# Patient Record
Sex: Female | Born: 1965 | Race: Black or African American | Hispanic: No | Marital: Single | Smoking: Never smoker
Health system: Southern US, Community
[De-identification: ages and names within clinical notes are randomized; demographics above are authoritative.]

## PROBLEM LIST (undated history)

## (undated) DIAGNOSIS — I639 Cerebral infarction, unspecified: Secondary | ICD-10-CM

## (undated) DIAGNOSIS — I219 Acute myocardial infarction, unspecified: Secondary | ICD-10-CM

## (undated) DIAGNOSIS — N289 Disorder of kidney and ureter, unspecified: Secondary | ICD-10-CM

## (undated) DIAGNOSIS — E7800 Pure hypercholesterolemia, unspecified: Secondary | ICD-10-CM

## (undated) DIAGNOSIS — D649 Anemia, unspecified: Secondary | ICD-10-CM

## (undated) DIAGNOSIS — M109 Gout, unspecified: Secondary | ICD-10-CM

## (undated) DIAGNOSIS — I10 Essential (primary) hypertension: Secondary | ICD-10-CM

## (undated) DIAGNOSIS — I2510 Atherosclerotic heart disease of native coronary artery without angina pectoris: Secondary | ICD-10-CM

## (undated) DIAGNOSIS — M1990 Unspecified osteoarthritis, unspecified site: Secondary | ICD-10-CM

## (undated) HISTORY — PX: APPENDECTOMY: SHX54

## (undated) HISTORY — PX: TUBAL LIGATION: SHX77

## (undated) HISTORY — PX: CHOLECYSTECTOMY: SHX55

---

## 2017-02-07 ENCOUNTER — Emergency Department (HOSPITAL_COMMUNITY): Payer: Medicaid - Out of State

## 2017-02-07 ENCOUNTER — Other Ambulatory Visit: Payer: Self-pay

## 2017-02-07 ENCOUNTER — Emergency Department (HOSPITAL_COMMUNITY)
Admission: EM | Admit: 2017-02-07 | Discharge: 2017-02-07 | Disposition: A | Discharge status: Home / Self Care | Payer: Medicaid - Out of State | Attending: Emergency Medicine | Admitting: Emergency Medicine

## 2017-02-07 ENCOUNTER — Encounter (HOSPITAL_COMMUNITY): Payer: Self-pay

## 2017-02-07 DIAGNOSIS — Z79899 Other long term (current) drug therapy: Secondary | ICD-10-CM | POA: Insufficient documentation

## 2017-02-07 DIAGNOSIS — I10 Essential (primary) hypertension: Secondary | ICD-10-CM | POA: Diagnosis not present

## 2017-02-07 DIAGNOSIS — R197 Diarrhea, unspecified: Secondary | ICD-10-CM | POA: Diagnosis not present

## 2017-02-07 DIAGNOSIS — R1110 Vomiting, unspecified: Secondary | ICD-10-CM | POA: Diagnosis not present

## 2017-02-07 DIAGNOSIS — R079 Chest pain, unspecified: Secondary | ICD-10-CM | POA: Diagnosis present

## 2017-02-07 DIAGNOSIS — I259 Chronic ischemic heart disease, unspecified: Secondary | ICD-10-CM | POA: Diagnosis not present

## 2017-02-07 DIAGNOSIS — R1010 Upper abdominal pain, unspecified: Secondary | ICD-10-CM | POA: Diagnosis not present

## 2017-02-07 DIAGNOSIS — R0789 Other chest pain: Secondary | ICD-10-CM | POA: Diagnosis not present

## 2017-02-07 HISTORY — DX: Unspecified osteoarthritis, unspecified site: M19.90

## 2017-02-07 HISTORY — DX: Pure hypercholesterolemia, unspecified: E78.00

## 2017-02-07 HISTORY — DX: Acute myocardial infarction, unspecified: I21.9

## 2017-02-07 HISTORY — DX: Disorder of kidney and ureter, unspecified: N28.9

## 2017-02-07 HISTORY — DX: Anemia, unspecified: D64.9

## 2017-02-07 HISTORY — DX: Gout, unspecified: M10.9

## 2017-02-07 HISTORY — DX: Atherosclerotic heart disease of native coronary artery without angina pectoris: I25.10

## 2017-02-07 HISTORY — DX: Essential (primary) hypertension: I10

## 2017-02-07 HISTORY — DX: Cerebral infarction, unspecified: I63.9

## 2017-02-07 LAB — RAPID URINE DRUG SCREEN, HOSP PERFORMED
Amphetamines: NOT DETECTED
BENZODIAZEPINES: NOT DETECTED
Barbiturates: NOT DETECTED
COCAINE: NOT DETECTED
Opiates: POSITIVE — AB
TETRAHYDROCANNABINOL: NOT DETECTED

## 2017-02-07 LAB — COMPREHENSIVE METABOLIC PANEL
ALK PHOS: 56 U/L (ref 38–126)
ALT: 12 U/L — ABNORMAL LOW (ref 14–54)
ANION GAP: 17 — AB (ref 5–15)
AST: 22 U/L (ref 15–41)
Albumin: 4.6 g/dL (ref 3.5–5.0)
BILIRUBIN TOTAL: 0.4 mg/dL (ref 0.3–1.2)
BUN: 34 mg/dL — ABNORMAL HIGH (ref 6–20)
CALCIUM: 9.5 mg/dL (ref 8.9–10.3)
CO2: 19 mmol/L — AB (ref 22–32)
Chloride: 104 mmol/L (ref 101–111)
Creatinine, Ser: 2.54 mg/dL — ABNORMAL HIGH (ref 0.44–1.00)
GFR calc non Af Amer: 21 mL/min — ABNORMAL LOW (ref >60)
GFR, EST AFRICAN AMERICAN: 24 mL/min — AB (ref >60)
Glucose, Bld: 79 mg/dL (ref 65–99)
Potassium: 3.8 mmol/L (ref 3.5–5.1)
SODIUM: 140 mmol/L (ref 135–145)
TOTAL PROTEIN: 8.8 g/dL — AB (ref 6.5–8.1)

## 2017-02-07 LAB — POC OCCULT BLOOD, ED: Fecal Occult Bld: NEGATIVE

## 2017-02-07 LAB — CBC WITH DIFFERENTIAL/PLATELET
Basophils Absolute: 0 10*3/uL (ref 0.0–0.1)
Basophils Relative: 0 %
EOS ABS: 0.1 10*3/uL (ref 0.0–0.7)
Eosinophils Relative: 2 %
HCT: 35.3 % — ABNORMAL LOW (ref 36.0–46.0)
Hemoglobin: 11.4 g/dL — ABNORMAL LOW (ref 12.0–15.0)
LYMPHS ABS: 1.9 10*3/uL (ref 0.7–4.0)
Lymphocytes Relative: 26 %
MCH: 29.8 pg (ref 26.0–34.0)
MCHC: 32.3 g/dL (ref 30.0–36.0)
MCV: 92.4 fL (ref 78.0–100.0)
MONO ABS: 0.5 10*3/uL (ref 0.1–1.0)
MONOS PCT: 6 %
NEUTROS PCT: 66 %
Neutro Abs: 4.7 10*3/uL (ref 1.7–7.7)
Platelets: 372 10*3/uL (ref 150–400)
RBC: 3.82 MIL/uL — ABNORMAL LOW (ref 3.87–5.11)
RDW: 16.1 % — AB (ref 11.5–15.5)
WBC: 7.1 10*3/uL (ref 4.0–10.5)

## 2017-02-07 LAB — URINALYSIS, ROUTINE W REFLEX MICROSCOPIC
Bilirubin Urine: NEGATIVE
Glucose, UA: NEGATIVE mg/dL
Hgb urine dipstick: NEGATIVE
Ketones, ur: NEGATIVE mg/dL
Leukocytes, UA: NEGATIVE
Nitrite: NEGATIVE
PH: 5 (ref 5.0–8.0)
Protein, ur: 100 mg/dL — AB
RBC / HPF: NONE SEEN RBC/hpf (ref 0–5)
SPECIFIC GRAVITY, URINE: 1.012 (ref 1.005–1.030)

## 2017-02-07 LAB — TROPONIN I: Troponin I: <0.03 ng/mL (ref <0.03)

## 2017-02-07 LAB — LIPASE, BLOOD: Lipase: 45 U/L (ref 11–51)

## 2017-02-07 MED ORDER — GI COCKTAIL ~~LOC~~
30.0000 mL | Freq: Once | ORAL | Status: AC
  Administered 2017-02-07: 30 mL via ORAL
  Filled 2017-02-07: qty 30

## 2017-02-07 MED ORDER — IOPAMIDOL (ISOVUE-300) INJECTION 61%
INTRAVENOUS | Status: AC
  Filled 2017-02-07: qty 30

## 2017-02-07 MED ORDER — ONDANSETRON HCL 4 MG PO TABS: 4.0000 mg | ORAL_TABLET | Freq: Four times a day (QID) | ORAL | 0 refills | Status: AC | PRN

## 2017-02-07 MED ORDER — MORPHINE SULFATE (PF) 4 MG/ML IV SOLN
4.0000 mg | Freq: Once | INTRAVENOUS | Status: AC
  Administered 2017-02-07: 4 mg via INTRAVENOUS
  Filled 2017-02-07: qty 1

## 2017-02-07 MED ORDER — SODIUM CHLORIDE 0.9 % IV SOLN
80.0000 mg | Freq: Once | INTRAVENOUS | Status: AC
  Administered 2017-02-07: 13:00:00 80 mg via INTRAVENOUS
  Filled 2017-02-07: qty 80

## 2017-02-07 MED ORDER — SODIUM CHLORIDE 0.9 % IV BOLUS (SEPSIS)
500.0000 mL | Freq: Once | INTRAVENOUS | Status: AC
  Administered 2017-02-07: 500 mL via INTRAVENOUS

## 2017-02-07 MED ORDER — ONDANSETRON HCL 4 MG/2ML IJ SOLN
4.0000 mg | Freq: Once | INTRAMUSCULAR | Status: AC
  Administered 2017-02-07: 4 mg via INTRAVENOUS
  Filled 2017-02-07: qty 2

## 2017-02-07 MED ORDER — SUCRALFATE 1 G PO TABS: 1.0000 g | ORAL_TABLET | Freq: Three times a day (TID) | ORAL | 0 refills | Status: AC

## 2017-02-07 NOTE — ED Notes (Signed)
EKG given to Dr. Knapp. 

## 2017-02-07 NOTE — ED Triage Notes (Signed)
Patient reports of mid chest pain that started 3 days ago that radiates down left arm with left arm tingling x3 days. Also reports of lowe abdominal pain with vomiting and diarrhea x4 days.

## 2017-02-07 NOTE — ED Provider Notes (Signed)
[facility] EMERGENCY DEPARTMENT Provider Note   CSN: [id number] Arrival date & time: 02/07/17  1143     History   Chief Complaint Chief Complaint  Patient presents with  . Chest Pain  . Abdominal Pain    HPI Susan Watts is a 52 y.o. female.  HPI Patient presents with abdominal pain, vomiting and diarrhea which started 4 days ago.  She has had multiple episodes of each.  Denies any melanotic or grossly bloody stools.  States that 2 days ago she developed pain in the upper abdomen radiating into her chest.  Today she is vomited 3 times and noted blood clot in the vomit.  Continues to have central chest pain.  No shortness of breath.  No new lower extremity swelling or pain. Past Medical History:  Diagnosis Date  . Anemia   . Arthritis   . Coronary artery disease   . Gout   . High cholesterol   . Hypertension   . MI (myocardial infarction) (HCC)   . Renal disorder   . Stroke [facility])     There are no active problems to display for this patient.   Past Surgical History:  Procedure Laterality Date  . APPENDECTOMY    . CHOLECYSTECTOMY    . TUBAL LIGATION      OB History    No data available       Home Medications    Prior to Admission medications   Medication Sig Start Date End Date Taking? Authorizing Provider  allopurinol (ZYLOPRIM) 100 MG tablet Take 100 mg by mouth daily as needed.   Yes [provider]  amLODipine (NORVASC) 10 MG tablet Take 10 mg by mouth daily.   Yes [provider]  atorvastatin (LIPITOR) 20 MG tablet Take 20 mg by mouth daily.    Yes [provider]  colchicine 0.6 MG tablet Take 0.6 mg by mouth daily.   Yes [provider]  ferrous sulfate 325 (65 FE) MG EC tablet Take 325 mg by mouth 3 (three) times daily with meals.   Yes [provider]  pantoprazole (PROTONIX) 40 MG tablet Take 40 mg by mouth daily.   Yes [provider]  predniSONE (DELTASONE) 20 MG tablet Take 20 mg by mouth  daily as needed.    Yes [provider]  Vitamin D, Ergocalciferol, (DRISDOL) 50000 units CAPS capsule Take 50,000 Units by mouth every 7 (seven) days.   Yes [provider]  ondansetron (ZOFRAN) 4 MG tablet Take 1 tablet (4 mg total) by mouth every 6 (six) hours as needed for nausea or vomiting. 02/07/17   Loren Racer, MD  sucralfate (CARAFATE) 1 g tablet Take 1 tablet (1 g total) by mouth 4 (four) times daily -  with meals and at bedtime. 02/07/17   Loren Racer, MD    Family History No family history on file.  Social History Social History   Tobacco Use  . Smoking status: Never Smoker  . Smokeless tobacco: Never Used  Substance Use Topics  . Alcohol use: No    Frequency: Never  . Drug use: No     Allergies   Aspirin   Review of Systems Review of Systems  Constitutional: Negative for chills and fever.  Respiratory: Negative for cough and shortness of breath.   Cardiovascular: Positive for chest pain. Negative for palpitations and leg swelling.  Gastrointestinal: Positive for abdominal pain, diarrhea, nausea and vomiting. Negative for blood in stool.  Genitourinary: Negative for dysuria, flank pain,  frequency and hematuria.  Musculoskeletal: Negative for back pain, myalgias and neck pain.  Skin: Negative for rash and wound.  Neurological: Negative for dizziness, weakness, light-headedness, numbness and headaches.  All other systems reviewed and are negative.    Physical Exam Updated Vital Signs BP (!) 129/99   Pulse (!) 113   Temp 98.2 F (36.8 C) (Oral)   Resp (!) 21   Ht 5\' 2"  (1.575 m)   Wt 42.6 kg (94 lb)   SpO2 99%   BMI 17.19 kg/m   Physical Exam  Constitutional: She is oriented to person, place, and time. She appears well-developed and well-nourished. No distress.  Emaciated appearing  HENT:  Head: Normocephalic and atraumatic.  Mouth/Throat: Oropharynx is clear and moist. No oropharyngeal exudate.  Eyes: EOM are normal. Pupils  are equal, round, and reactive to light.  Neck: Normal range of motion. Neck supple.  Cardiovascular: Regular rhythm. Exam reveals no gallop and no friction rub.  No murmur heard. Tachycardia  Pulmonary/Chest: Effort normal and breath sounds normal. No stridor. No respiratory distress. She has no wheezes. She has no rales. She exhibits tenderness.  Patient has some central lobar chest tenderness to palpation.  There is no crepitance or deformity.  Abdominal: Soft. Bowel sounds are normal. There is tenderness. There is no rebound and no guarding.  Diffuse abdominal tenderness most pronounced in the epigastrium and bilateral lower quadrants.  No rebound or guarding.  Musculoskeletal: Normal range of motion. She exhibits no edema or tenderness.  No CVA tenderness.  No lower extremity swelling, asymmetry or tenderness.  Neurological: She is alert and oriented to person, place, and time.  Moves all extremities without focal deficit.  Sensation intact.  Skin: Skin is warm and dry. Capillary refill takes less than 2 seconds. No rash noted. She is not diaphoretic. No erythema.  Psychiatric: She has a normal mood and affect. Her behavior is normal.  Nursing note and vitals reviewed.    ED Treatments / Results  Labs (all labs ordered are listed, but only abnormal results are displayed) Labs Reviewed  CBC WITH DIFFERENTIAL/PLATELET - Abnormal; Notable for the following components:      Result Value   RBC 3.82 (*)    Hemoglobin 11.4 (*)    HCT 35.3 (*)    RDW 16.1 (*)    All other components within normal limits  COMPREHENSIVE METABOLIC PANEL - Abnormal; Notable for the following components:   CO2 19 (*)    BUN 34 (*)    Creatinine, Ser 2.54 (*)    Total Protein 8.8 (*)    ALT 12 (*)    GFR calc non Af Amer 21 (*)    GFR calc Af Amer 24 (*)    Anion gap 17 (*)    All other components within normal limits  URINALYSIS, ROUTINE W REFLEX MICROSCOPIC - Abnormal; Notable for the following  components:   APPearance HAZY (*)    Protein, ur 100 (*)    Bacteria, UA RARE (*)    Squamous Epithelial / LPF 6-30 (*)    All other components within normal limits  RAPID URINE DRUG SCREEN, HOSP PERFORMED - Abnormal; Notable for the following components:   Opiates POSITIVE (*)    All other components within normal limits  LIPASE, BLOOD  TROPONIN I  TROPONIN I  OCCULT BLOOD X 1 CARD TO LAB, STOOL  POC OCCULT BLOOD, ED    EKG  EKG Interpretation  Date/Time:  Saturday February 07 2017 11:54:53  EST Ventricular Rate:  107 PR Interval:    QRS Duration: 104 QT Interval:  350 QTC Calculation: 467 R Axis:   -8 Text Interpretation:  Sinus tachycardia Probable left atrial enlargement Abnormal T, consider ischemia, lateral leads No old tracing to compare Confirmed by Linwood Dibbles [phone]) on 02/07/2017 11:58:38 AM       Radiology Ct Abdomen Pelvis Wo Contrast  Result Date: 02/07/2017 CLINICAL DATA:  Abdominal pain in the lower abdomen for 4 days. Nausea, vomiting, diarrhea EXAM: CT ABDOMEN AND PELVIS WITHOUT CONTRAST TECHNIQUE: Multidetector CT imaging of the abdomen and pelvis was performed following the standard protocol without IV contrast. COMPARISON:  None. FINDINGS: Lower chest: Bibasilar atelectasis versus mild scarring. Hepatobiliary: No focal liver abnormality is seen. Status post cholecystectomy. No biliary dilatation. Pancreas: Unremarkable. No pancreatic ductal dilatation or surrounding inflammatory changes. Spleen: Normal in size without focal abnormality. Adrenals/Urinary Tract: Normal adrenal glands. Normal left kidney. Atrophic right kidney. Nonobstructing 7 mm right lower pole renal calculus. Overall, no obstructive uropathy. 7 mm calcification anterior to the psoas muscle in the region of right ureter at the level of L5 which is difficult to delineate whether it is within the ureter or a phlebolith adjacent to the ureter, but there is no obstructive uropathy resulting from the  calcification suggesting calcification outside the ureter versus a chronic ureteral calculus in a nonfunctioning kidney. Normal distended bladder. Stomach/Bowel: Small hiatal hernia. Prior appendectomy. No evidence of bowel wall thickening, distention, or inflammatory changes. Large amount of stool in the rectum. Vascular/Lymphatic: Normal caliber abdominal aorta. No lymphadenopathy. Reproductive: Uterus and bilateral adnexa are unremarkable. Other: No abdominal wall hernia or abnormality. No abdominopelvic ascites. Musculoskeletal: No acute osseous abnormality. No aggressive osseous lesion. IMPRESSION: 1. Rectal fecal impaction. 2. No bowel obstruction. 3. Atrophic right kidney. Nonobstructing 7 mm right lower pole renal calculus. 7 mm calcification anterior to the psoas muscle in the region of right ureter at the level of L5 which is difficult to delineate whether it is within the ureter or adjacent to the ureter, but there is no obstructive uropathy resulting from the calcification suggesting calcification outside the ureter versus a chronic ureteral calculus in a nonfunctioning kidney. Electronically Signed   By: Elige Ko   On: 02/07/2017 17:02   Dg Chest 2 View  Result Date: 02/07/2017 CLINICAL DATA:  Chest pain and weakness, Patient reports of mid chest pain that started 3 days ago that radiates down left arm with left arm tingling x3 days. Also reports of lowe abdominal pain with vomiting and diarrhea x4 days. EXAM: CHEST - 2 VIEW COMPARISON:  none FINDINGS: Lungs are clear. Heart size and mediastinal contours are within normal limits. No effusion. Visualized bones unremarkable.  Cholecystectomy clips. IMPRESSION: No acute cardiopulmonary disease. Electronically Signed   By: Corlis Leak M.D.   On: 02/07/2017 13:32    Procedures Procedures (including critical care time)  Medications Ordered in ED Medications  iopamidol (ISOVUE-300) 61 % injection (not administered)  sodium chloride 0.9 % bolus  500 mL (0 mLs Intravenous Stopped 02/07/17 1405)  morphine 4 MG/ML injection 4 mg (4 mg Intravenous Given 02/07/17 1303)  ondansetron (ZOFRAN) injection 4 mg (4 mg Intravenous Given 02/07/17 1303)  pantoprazole (PROTONIX) 80 mg in sodium chloride 0.9 % 100 mL IVPB (0 mg Intravenous Stopped 02/07/17 1405)  sodium chloride 0.9 % bolus 500 mL (0 mLs Intravenous Stopped 02/07/17 1500)  gi cocktail (Maalox,Lidocaine,Donnatal) (30 mLs Oral Given 02/07/17 1743)     Initial Impression /  Assessment and Plan / ED Course  I have reviewed the triage vital signs and the nursing notes.  Pertinent labs & imaging results that were available during my care of the patient were reviewed by me and considered in my medical decision making (see chart for details).    Patient's creatinine mildly elevated over her baseline of 2.  Hemoglobin is stable compared to previous lab test performed earlier this year.  She does have a history of gastritis which is likely responsible for her pain.  She is advised to continue Protonix will start on Carafate.  Will give referral to gastroenterology.  Return precautions have been given.  Chest pain is atypical and likely related to her gastric intestinal symptoms.  Troponin x2 is normal.  Low suspicion for PE.  Oral intake.  She has had no further vomiting in the emergency department.  Hemoccult stool is negative. Final Clinical Impressions(s) / ED Diagnoses   Final diagnoses:  Pain of upper abdomen  Atypical chest pain  Non-intractable vomiting, presence of nausea not specified, unspecified vomiting type    ED Discharge Orders        Ordered    sucralfate (CARAFATE) 1 g tablet  3 times daily with meals & bedtime     02/07/17 1855    ondansetron (ZOFRAN) 4 MG tablet  Every 6 hours PRN     02/07/17 1855       Loren RacerYelverton, Journe Hallmark, MD 02/07/17 [phone]

## 2019-05-03 IMAGING — CT CT ABD-PELV W/O CM
2 of 4 series · 16 of 46 positions shown, 18 images · non-contrast
Comparison: None.

CLINICAL DATA: Abdominal pain in the lower abdomen for 4 days.
Nausea, vomiting, diarrhea

EXAM:
CT ABDOMEN AND PELVIS WITHOUT CONTRAST
TECHNIQUE: Multidetector CT imaging of the abdomen and pelvis was performed
following the standard protocol without IV contrast.

[Series 2: axial st · axial · 0.67mm/px · z∈[+958,+1373]mm · 13 of 91 slices shown, 15 images]
[im 4/91  soft-tissue]
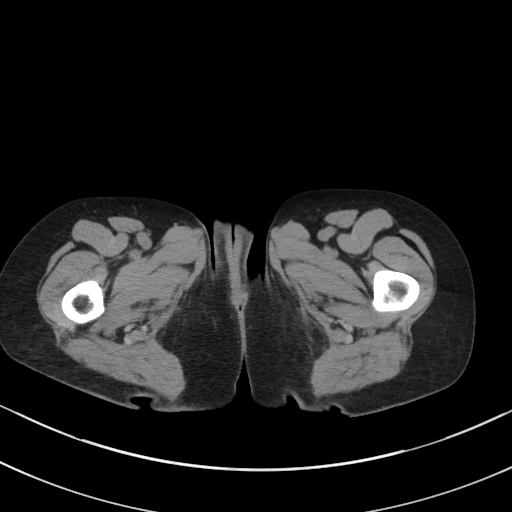
[im 4/91  bone]
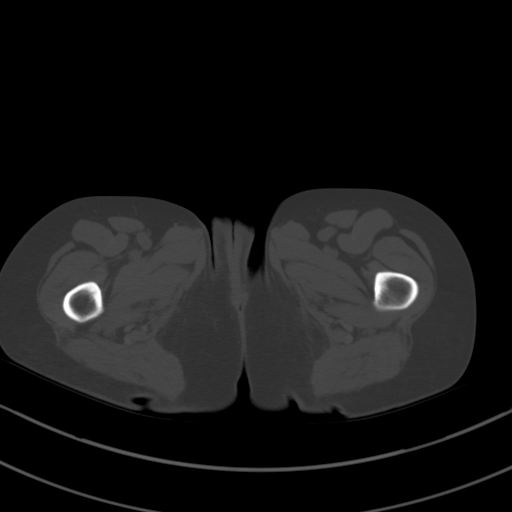
[im 11/91  soft-tissue]
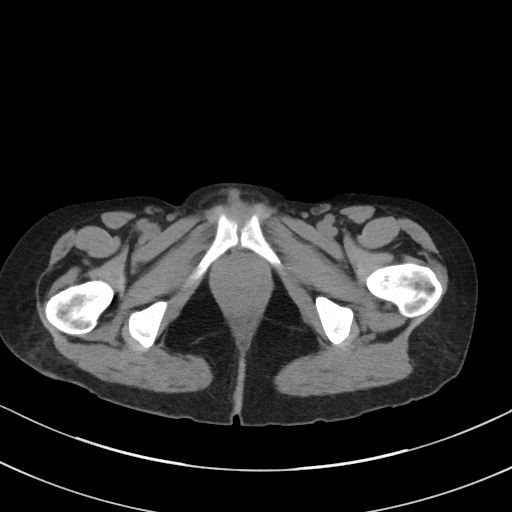
[im 19/91  soft-tissue]
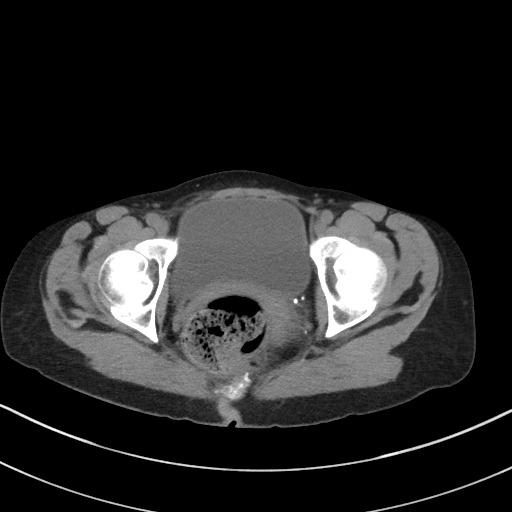
[im 26/91  soft-tissue]
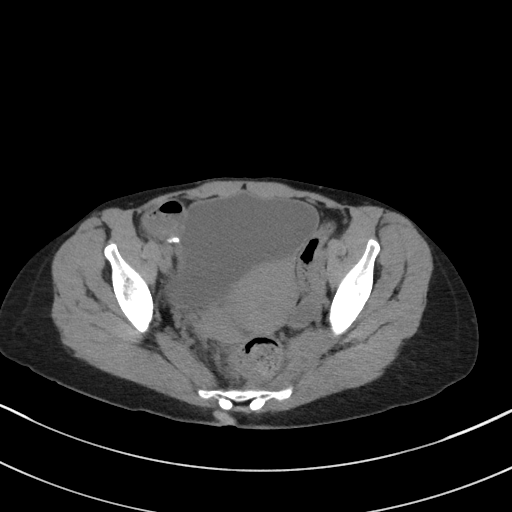
[im 33/91  soft-tissue]
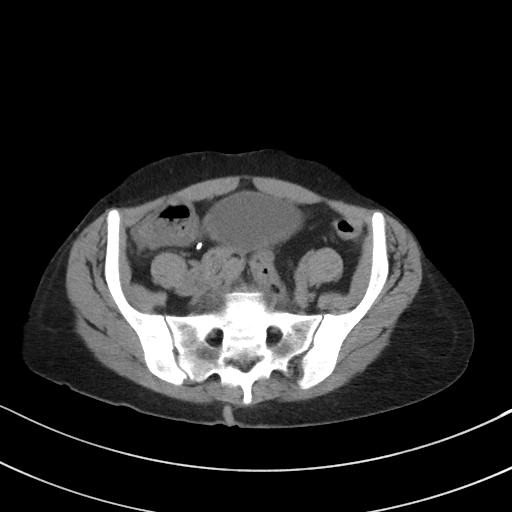
[im 40/91  soft-tissue]
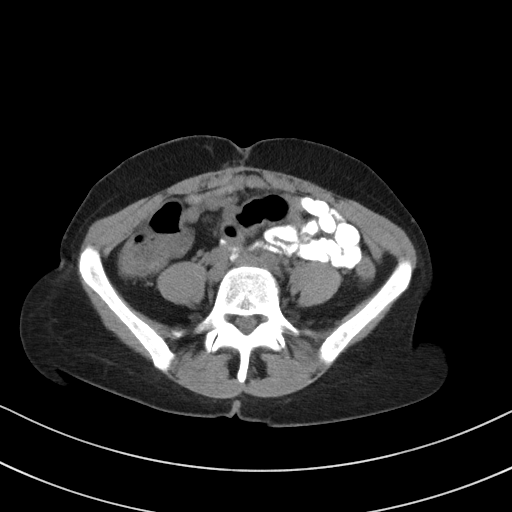
[im 47/91  soft-tissue]
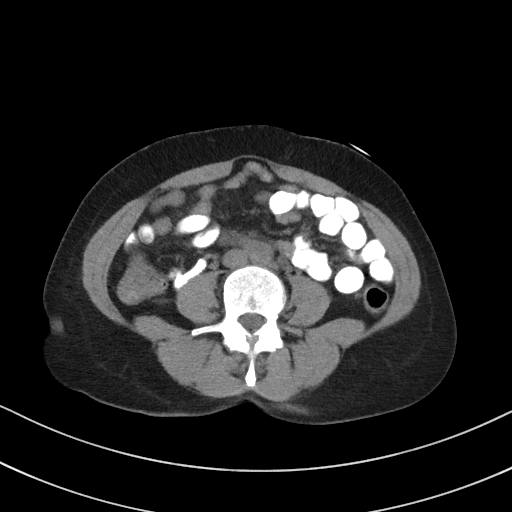
[im 51/91  soft-tissue]
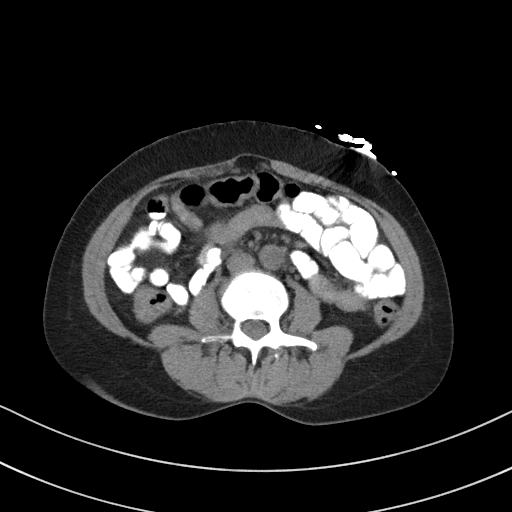
[im 58/91  soft-tissue]
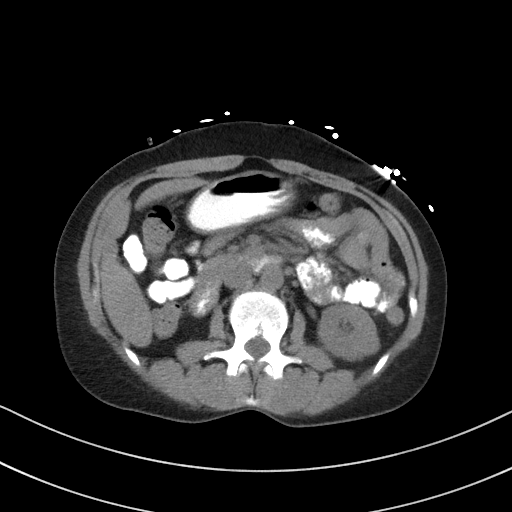
[im 58/91  bone]
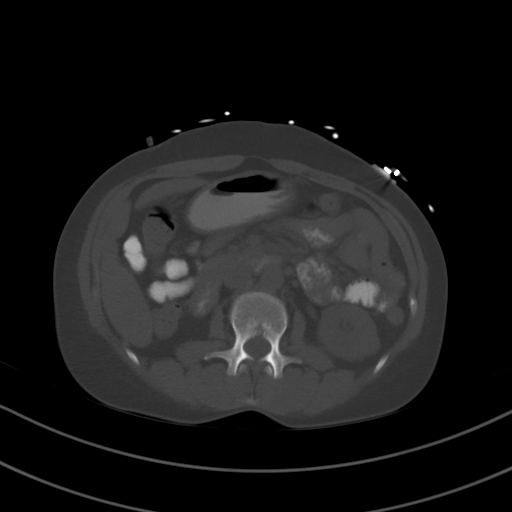
[im 65/91  soft-tissue]
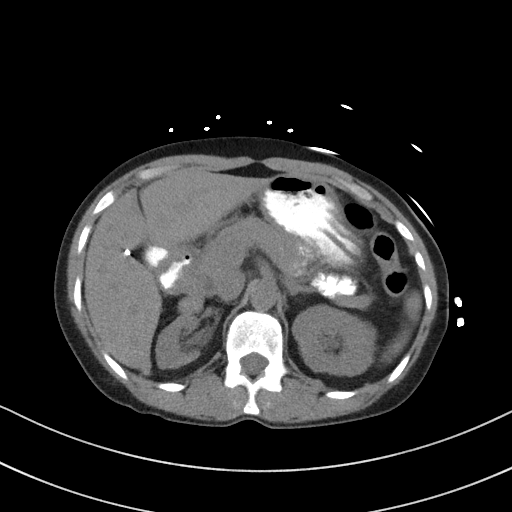
[im 73/91  soft-tissue]
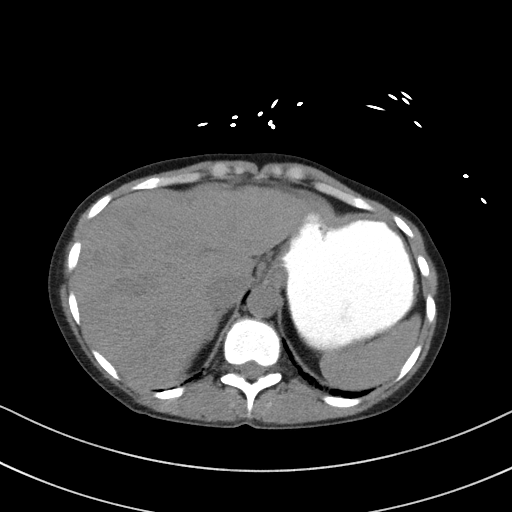
[im 80/91  soft-tissue]
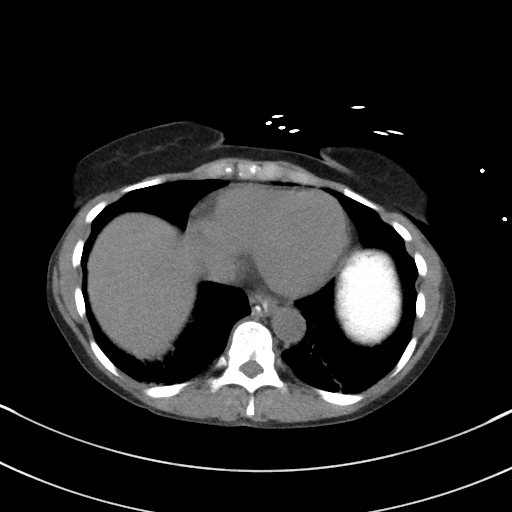
[im 87/91  soft-tissue]
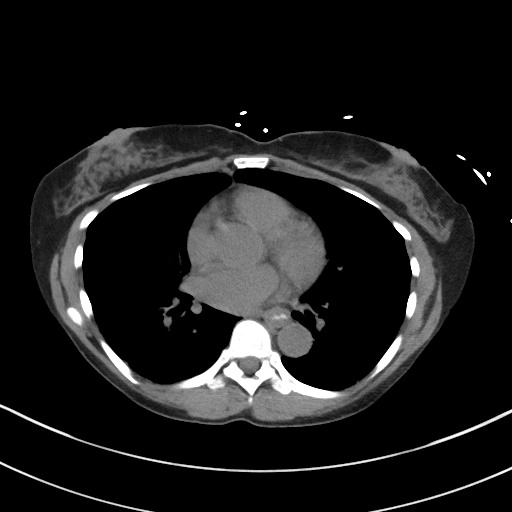

[Series 5: coronal st · coronal · 0.69mm/px · 3 of 69 slices shown]
[im 23/69  soft-tissue]
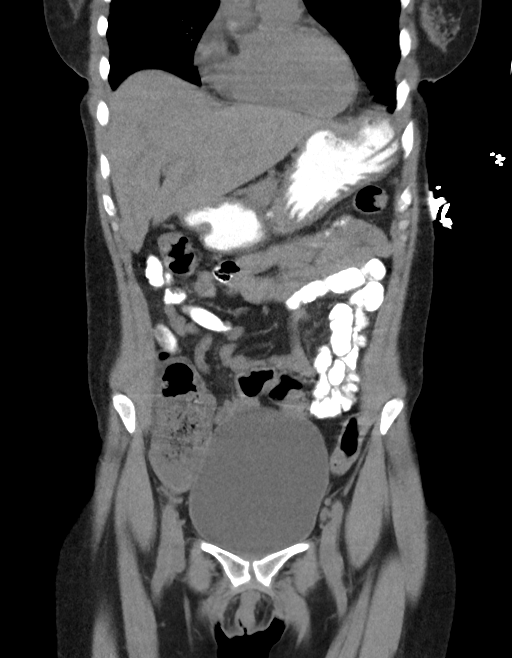
[im 31/69  soft-tissue]
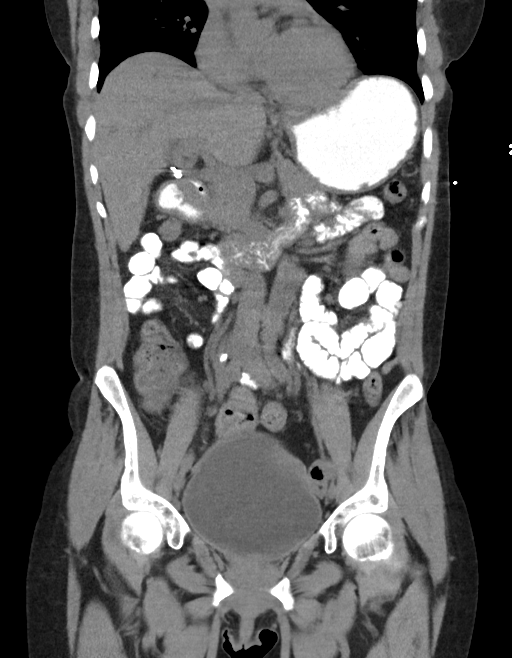
[im 38/69  soft-tissue]
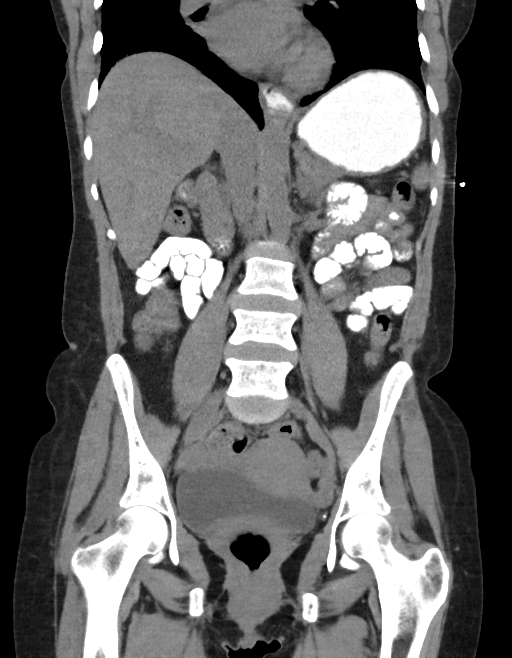

[16 of 46 positions shown; findings below may reference images not displayed]

FINDINGS: Lower chest: Bibasilar atelectasis versus mild scarring.

Hepatobiliary: No focal liver abnormality is seen. Status post
cholecystectomy. No biliary dilatation.

Pancreas: Unremarkable. No pancreatic ductal dilatation or
surrounding inflammatory changes.

Spleen: Normal in size without focal abnormality.

Adrenals/Urinary Tract: Normal adrenal glands. Normal left kidney.
Atrophic right kidney. Nonobstructing 7 mm right lower pole renal
calculus. Overall, no obstructive uropathy. 7 mm calcification
anterior to the psoas muscle in the region of right ureter at the
level of L5 which is difficult to delineate whether it is within the
ureter or a phlebolith adjacent to the ureter, but there is no
obstructive uropathy resulting from the calcification suggesting
calcification outside the ureter versus a chronic ureteral calculus
in a nonfunctioning kidney. Normal distended bladder.

Stomach/Bowel: Small hiatal hernia. Prior appendectomy. No evidence
of bowel wall thickening, distention, or inflammatory changes. Large
amount of stool in the rectum.

Vascular/Lymphatic: Normal caliber abdominal aorta. No
lymphadenopathy.

Reproductive: Uterus and bilateral adnexa are unremarkable.

Other: No abdominal wall hernia or abnormality. No abdominopelvic
ascites.

Musculoskeletal: No acute osseous abnormality. No aggressive osseous
lesion.
IMPRESSION: 1. Rectal fecal impaction.
2. No bowel obstruction.
3. Atrophic right kidney. Nonobstructing 7 mm right lower pole renal
calculus. 7 mm calcification anterior to the psoas muscle in the
region of right ureter at the level of L5 which is difficult to
delineate whether it is within the ureter or adjacent to the ureter,
but there is no obstructive uropathy resulting from the
calcification suggesting calcification outside the ureter versus a
chronic ureteral calculus in a nonfunctioning kidney.

## 2019-05-03 IMAGING — DX DG CHEST 2V
2 series · 2 of 2 positions shown · non-contrast
Comparison: none

CLINICAL DATA: Chest pain and weakness, Patient reports of mid
chest pain that started 3 days ago that radiates down left arm with
left arm tingling x3 days. Also reports of Feist abdominal pain with
vomiting and diarrhea x4 days.

EXAM:
CHEST - 2 VIEW

[chest lat]
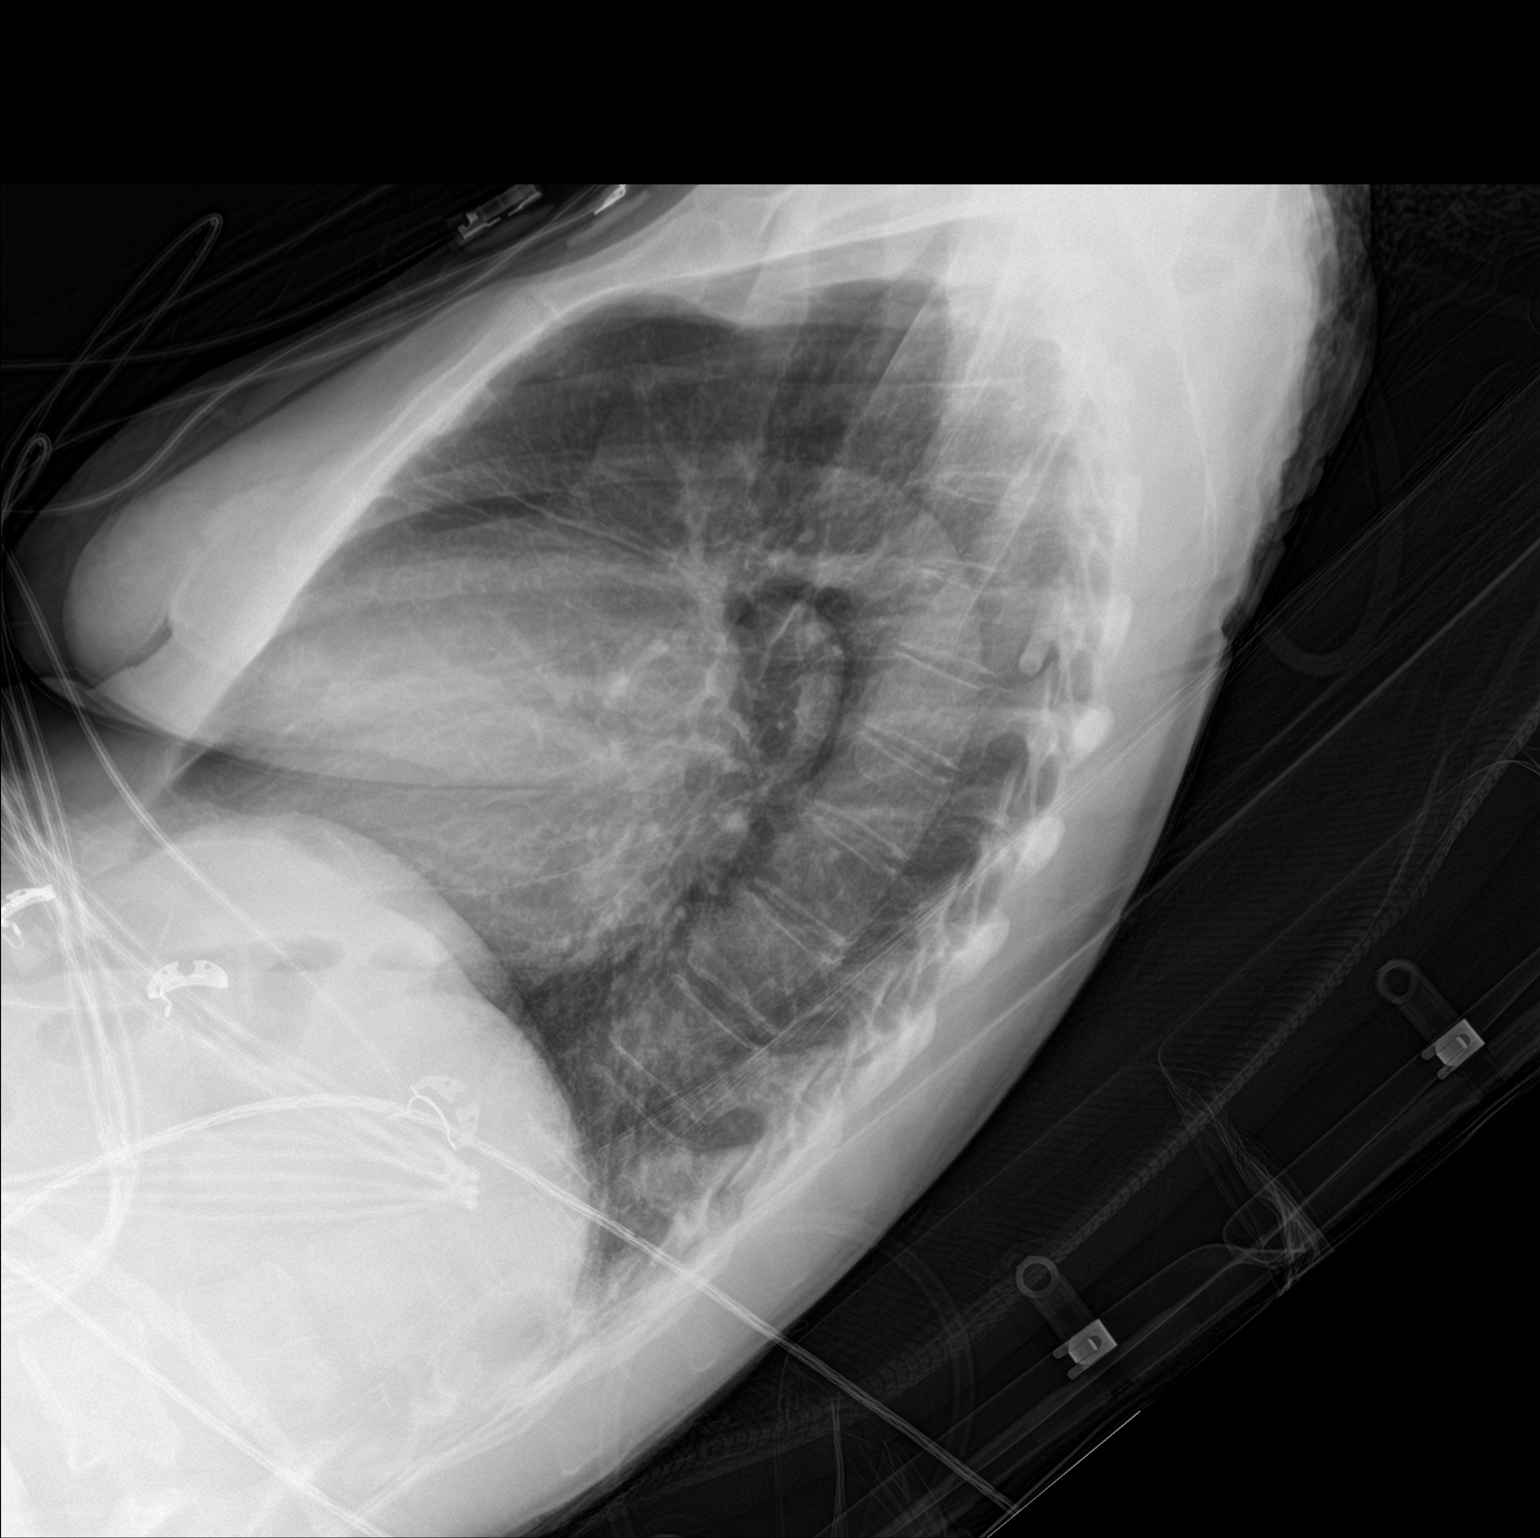

[chest ap]
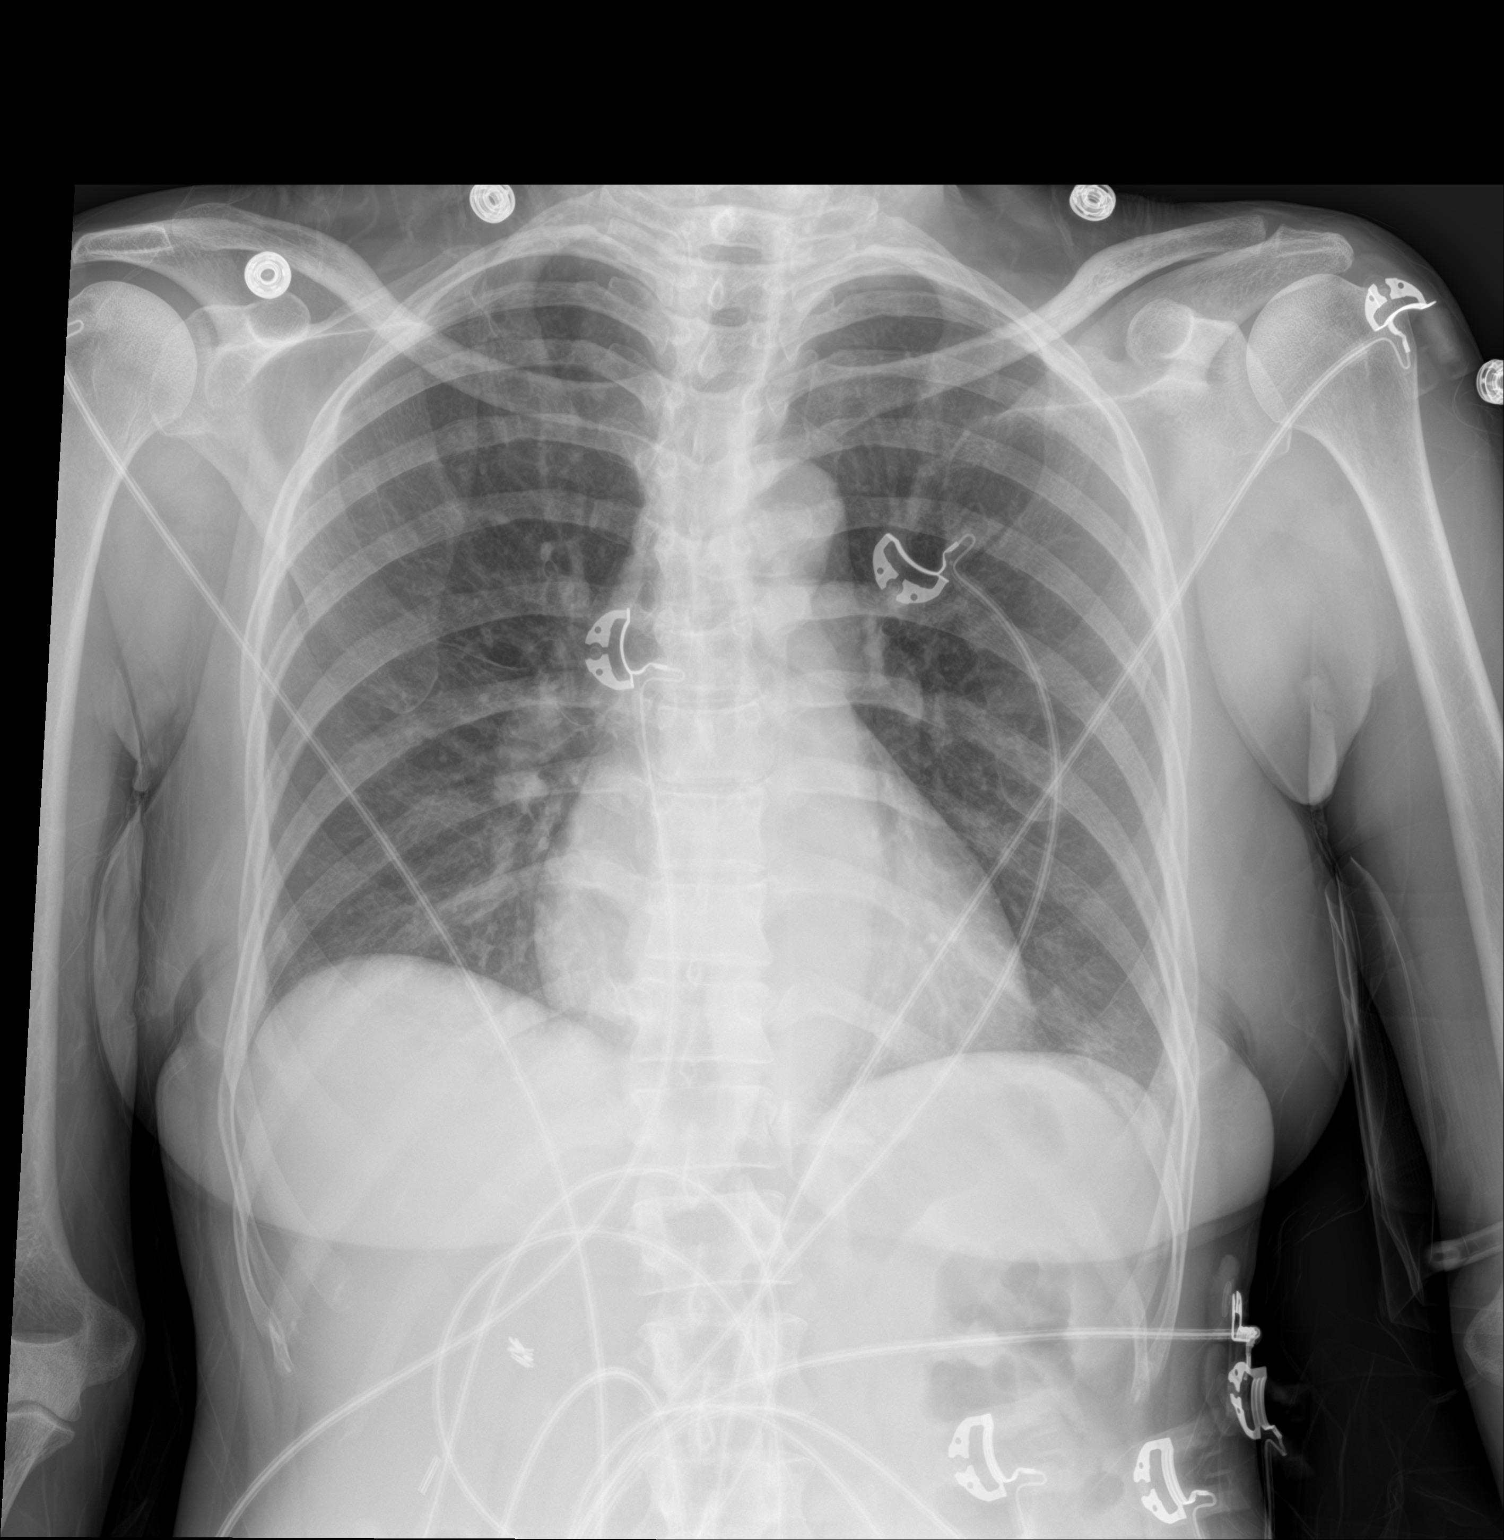

[2 of 2 positions shown; findings below may reference images not displayed]

FINDINGS: Lungs are clear.

Heart size and mediastinal contours are within normal limits.

No effusion.

Visualized bones unremarkable.  Cholecystectomy clips.
IMPRESSION: No acute cardiopulmonary disease.
